# Patient Record
Sex: Male | Born: 2000 | Race: Black or African American | Hispanic: No | Marital: Single | Smoking: Never smoker
Health system: Southern US, Community
[De-identification: ages and names within clinical notes are randomized; demographics above are authoritative.]

---
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
## 2023-11-28 ENCOUNTER — Other Ambulatory Visit (HOSPITAL_COMMUNITY): Payer: Self-pay

## 2023-11-29 ENCOUNTER — Other Ambulatory Visit (HOSPITAL_COMMUNITY): Payer: Self-pay

## 2023-11-29 MED ORDER — METRONIDAZOLE 500 MG PO TABS
2000.0000 mg | ORAL_TABLET | Freq: Once | ORAL | 0 refills | Status: AC
  Filled 2023-11-29: qty 4, 1d supply, fill #0
